# Patient Record
Sex: Male | Born: 1965 | Race: Black or African American | Hispanic: No | Marital: Married | Smoking: Current every day smoker
Health system: Southern US, Community
[De-identification: ages and names within clinical notes are randomized; demographics above are authoritative.]

## PROBLEM LIST (undated history)

## (undated) DIAGNOSIS — M419 Scoliosis, unspecified: Secondary | ICD-10-CM

## (undated) HISTORY — PX: HERNIA REPAIR: SHX51

---

## 2014-12-22 ENCOUNTER — Emergency Department (HOSPITAL_COMMUNITY): Payer: Self-pay

## 2014-12-22 ENCOUNTER — Emergency Department (HOSPITAL_COMMUNITY)
Admission: EM | Admit: 2014-12-22 | Discharge: 2014-12-22 | Disposition: A | Discharge status: Home / Self Care | Payer: Self-pay | Attending: Emergency Medicine | Admitting: Emergency Medicine

## 2014-12-22 ENCOUNTER — Encounter (HOSPITAL_COMMUNITY): Payer: Self-pay | Admitting: *Deleted

## 2014-12-22 DIAGNOSIS — Y990 Civilian activity done for income or pay: Secondary | ICD-10-CM | POA: Insufficient documentation

## 2014-12-22 DIAGNOSIS — Y9389 Activity, other specified: Secondary | ICD-10-CM | POA: Insufficient documentation

## 2014-12-22 DIAGNOSIS — Y9289 Other specified places as the place of occurrence of the external cause: Secondary | ICD-10-CM | POA: Insufficient documentation

## 2014-12-22 DIAGNOSIS — M62838 Other muscle spasm: Secondary | ICD-10-CM | POA: Insufficient documentation

## 2014-12-22 DIAGNOSIS — X58XXXA Exposure to other specified factors, initial encounter: Secondary | ICD-10-CM | POA: Insufficient documentation

## 2014-12-22 DIAGNOSIS — S39012A Strain of muscle, fascia and tendon of lower back, initial encounter: Secondary | ICD-10-CM | POA: Insufficient documentation

## 2014-12-22 DIAGNOSIS — Z720 Tobacco use: Secondary | ICD-10-CM | POA: Insufficient documentation

## 2014-12-22 HISTORY — DX: Scoliosis, unspecified: M41.9

## 2014-12-22 MED ORDER — TRAMADOL HCL 50 MG PO TABS: 50.0000 mg | ORAL_TABLET | Freq: Four times a day (QID) | ORAL | Status: AC | PRN

## 2014-12-22 MED ORDER — IBUPROFEN 800 MG PO TABS
800.0000 mg | ORAL_TABLET | Freq: Once | ORAL | Status: AC
  Administered 2014-12-22: 800 mg via ORAL
  Filled 2014-12-22: qty 1

## 2014-12-22 MED ORDER — TRAMADOL HCL 50 MG PO TABS
50.0000 mg | ORAL_TABLET | Freq: Once | ORAL | Status: AC
  Administered 2014-12-22: 50 mg via ORAL
  Filled 2014-12-22: qty 1

## 2014-12-22 MED ORDER — CYCLOBENZAPRINE HCL 5 MG PO TABS: 5.0000 mg | ORAL_TABLET | Freq: Three times a day (TID) | ORAL | Status: AC | PRN

## 2014-12-22 MED ORDER — IBUPROFEN 800 MG PO TABS: 800.0000 mg | ORAL_TABLET | Freq: Three times a day (TID) | ORAL | Status: AC

## 2014-12-22 NOTE — Discharge Instructions (Signed)
Lumbosacral Strain Lumbosacral strain is a strain of any of the parts that make up your lumbosacral vertebrae. Your lumbosacral vertebrae are the bones that make up the lower third of your backbone. Your lumbosacral vertebrae are held together by muscles and tough, fibrous tissue (ligaments).  CAUSES  A sudden blow to your back can cause lumbosacral strain. Also, anything that causes an excessive stretch of the muscles in the low back can cause this strain. This is typically seen when people exert themselves strenuously, fall, lift heavy objects, bend, or crouch repeatedly. RISK FACTORS  Physically demanding work.  Participation in pushing or pulling sports or sports that require a sudden twist of the back (tennis, golf, baseball).  Weight lifting.  Excessive lower back curvature.  Forward-tilted pelvis.  Weak back or abdominal muscles or both.  Tight hamstrings. SIGNS AND SYMPTOMS  Lumbosacral strain may cause pain in the area of your injury or pain that moves (radiates) down your leg.  DIAGNOSIS Your health care provider can often diagnose lumbosacral strain through a physical exam. In some cases, you may need tests such as X-ray exams.  TREATMENT  Treatment for your lower back injury depends on many factors that your clinician will have to evaluate. However, most treatment will include the use of anti-inflammatory medicines. HOME CARE INSTRUCTIONS   Avoid hard physical activities (tennis, racquetball, waterskiing) if you are not in proper physical condition for it. This may aggravate or create problems.  If you have a back problem, avoid sports requiring sudden body movements. Swimming and walking are generally safer activities.  Maintain good posture.  Maintain a healthy weight.  For acute conditions, you may put ice on the injured area.  Put ice in a plastic bag.  Place a towel between your skin and the bag.  Leave the ice on for 20 minutes, 2-3 times a day.  When the  low back starts healing, stretching and strengthening exercises may be recommended. SEEK MEDICAL CARE IF:  Your back pain is getting worse.  You experience severe back pain not relieved with medicines. SEEK IMMEDIATE MEDICAL CARE IF:   You have numbness, tingling, weakness, or problems with the use of your arms or legs.  There is a change in bowel or bladder control.  You have increasing pain in any area of the body, including your belly (abdomen).  You notice shortness of breath, dizziness, or feel faint.  You feel sick to your stomach (nauseous), are throwing up (vomiting), or become sweaty.  You notice discoloration of your toes or legs, or your feet get very cold. MAKE SURE YOU:   Understand these instructions.  Will watch your condition.  Will get help right away if you are not doing well or get worse. Document Released: 01/10/2005 Document Revised: 04/07/2013 Document Reviewed: 11/19/2012 [facility] Patient Information 2015 [place], [place]. This information is not intended to replace advice given to you by your health care provider. Make sure you discuss any questions you have with your health care provider.    Do not drive within 4 hours of taking tramadol or flexeril as these medicines can make you drowsy.  Avoid lifting,  Bending,  Twisting or any other activity that worsens your pain over the next week.  Apply an  icepack  to your lower back for 10-15 minutes every 2 hours for the next 2 days.  You should get rechecked if your symptoms are not better over the next 5 days,  Or you develop increased pain,  Weakness in  your leg(s) or loss of bladder or bowel function - these are symptoms of a worse injury.

## 2014-12-22 NOTE — ED Notes (Signed)
Pt states lower back pain after lifting cones at work. States stiffness to neck. Pain x 2 days.

## 2014-12-23 ENCOUNTER — Encounter (HOSPITAL_COMMUNITY): Payer: Self-pay | Admitting: Emergency Medicine

## 2014-12-23 ENCOUNTER — Emergency Department (HOSPITAL_COMMUNITY)
Admission: EM | Admit: 2014-12-23 | Discharge: 2014-12-23 | Disposition: A | Discharge status: Home / Self Care | Payer: Self-pay | Attending: Emergency Medicine | Admitting: Emergency Medicine

## 2014-12-23 DIAGNOSIS — M419 Scoliosis, unspecified: Secondary | ICD-10-CM | POA: Insufficient documentation

## 2014-12-23 DIAGNOSIS — M47896 Other spondylosis, lumbar region: Secondary | ICD-10-CM | POA: Insufficient documentation

## 2014-12-23 DIAGNOSIS — Z720 Tobacco use: Secondary | ICD-10-CM | POA: Insufficient documentation

## 2014-12-23 DIAGNOSIS — M545 Low back pain: Secondary | ICD-10-CM | POA: Insufficient documentation

## 2014-12-23 NOTE — ED Notes (Addendum)
Patient also requesting doctor's note for work stating he did not receive one yesterday.

## 2014-12-23 NOTE — ED Provider Notes (Signed)
CSN: [id number]     Arrival date & time 12/23/14  1929 History   First MD Initiated Contact with Patient 12/23/14 2151     Chief Complaint  Patient presents with  . Back Pain     (Consider location/radiation/quality/duration/timing/severity/associated sxs/prior Treatment) HPI Comments: Patient is a 49 year old male who presents to the emergency department with a complaint of back pain, and requesting a work note.  The patient was evaluated on yesterday September 7, at which time he states that he has significant pain and stiffness after lifting cones. The patient was evaluated. 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 There were no gross neurologic deficits appreciated at that time. The patient states however he did not receive a work note. He returns tonight for request a work note.  The history is provided by the patient.    Past Medical History  Diagnosis Date  . Scoliosis    Past Surgical History  Procedure Laterality Date  . Hernia repair     History reviewed. No pertinent family history. Social History  Substance Use Topics  . Smoking status: Current Every Day Smoker    Types: Cigarettes  . Smokeless tobacco: None  . Alcohol Use: Yes    Review of Systems  Musculoskeletal: Positive for back pain.  All other systems reviewed and are negative.     Allergies  Review of patient's allergies indicates no known allergies.  Home Medications   Prior to Admission medications   Medication Sig Start Date End Date Taking? Authorizing Provider  cyclobenzaprine (FLEXERIL) 5 MG tablet Take 1 tablet (5 mg total) by mouth 3 (three) times daily as needed. Patient not taking: Reported on 12/23/2014 12/22/14   Burgess Amor, PA-C  ibuprofen (ADVIL,MOTRIN) 800 MG tablet Take 1 tablet (800 mg total) by mouth 3 (three) times daily. Patient not taking: Reported on 12/23/2014 12/22/14   Burgess Amor, PA-C  traMADol (ULTRAM) 50 MG tablet Take 1 tablet (50 mg  total) by mouth every 6 (six) hours as needed. Patient not taking: Reported on 12/23/2014 12/22/14   Burgess Amor, PA-C   BP 117/64 mmHg  Pulse 69  Temp(Src) 98.4 F (36.9 C) (Oral)  Resp 18  SpO2 100% Physical Exam  Constitutional: He is oriented to person, place, and time. He appears well-developed and well-nourished.  Non-toxic appearance.  HENT:  Head: Normocephalic.  Right Ear: Tympanic membrane and external ear normal.  Left Ear: Tympanic membrane and external ear normal.  Eyes: EOM and lids are normal. Pupils are equal, round, and reactive to light.  Neck: Normal range of motion. Neck supple. Carotid bruit is not present.  Cardiovascular: Normal rate, regular rhythm, normal heart sounds, intact distal pulses and normal pulses.   Pulmonary/Chest: Breath sounds normal. No respiratory distress.  Abdominal: Soft. Bowel sounds are normal. There is no tenderness. There is no guarding.  Musculoskeletal: Normal range of motion.       Lumbar back: He exhibits tenderness and pain. He exhibits no deformity.  Lymphadenopathy:       Head (right side): No submandibular adenopathy present.       Head (left side): No submandibular adenopathy present.    He has no cervical adenopathy.  Neurological: He is alert and oriented to person, place, and time. He has normal strength. No cranial nerve deficit or sensory deficit.  Gait is steady. There is no foot drop. No gross neurologic deficits appreciated involving the lower extremities.  Skin: Skin is warm and dry.  Psychiatric:  He has a normal mood and affect. His speech is normal.  Nursing note and vitals reviewed.   ED Course  Procedures (including critical care time) Labs Review Labs Reviewed - No data to display  Imaging Review Dg Lumbar Spine Complete  12/22/2014   CLINICAL DATA:  Low back pain for 2 days radiating into the left leg and right side of the neck. No known injury.  EXAM: LUMBAR SPINE - COMPLETE 4+ VIEW  COMPARISON:  None.   FINDINGS: There is no fracture or malalignment. Mild loss of disc space height and endplate spurring are seen at L4-5 and L5-S1. No pars interarticularis defect is identified.  IMPRESSION: No acute abnormality.  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   Electronically Signed   By: Drusilla Kanner M.D.   On: 12/22/2014 19:47   I have personally reviewed and evaluated these images and lab results as part of my medical decision-making.   EKG Interpretation None      MDM  Vital signs within normal limits. I reviewed the x-ray from September 7, which reveals mild loss of disc space and endplate spurring at the L4-L5 and L5-S1 area. No gross neurologic deficit noted on tonight's examination. The patient will be given a work note to excuse him for September 7 and September 8. He will return to work on September 9. The Toradol was stopped at the patient's request. He states that it makes him nauseated, and makes him sleep too much. The patient is placed on Decadron 4 mg twice a day with food. Patient is referred to Dr. Victorino Dike for orthopedic evaluation of his back pain.    Final diagnoses:  None    *I have reviewed nursing notes, vital signs, and all appropriate lab and imaging results for this patient.**    Ivery Quale, PA-C 12/23/14 2224  Eber Hong, MD 12/24/14 [phone]

## 2014-12-23 NOTE — ED Notes (Signed)
Patient complaining of back pain for 3 days. History of chronic back pain. Reports was seen in ED yesterday for back pain and prescribed tramadol. Patient states he remembered after getting home that he does not do well with "generic drugs like tramdol". Patient states he tried taking tramadol, but it did not help with his pain.

## 2014-12-23 NOTE — Discharge Instructions (Signed)
Please see Dr. Victorino Dike for evaluation of your back if not improving. Back Pain, Adult Back pain is very common. The pain often gets better over time. The cause of back pain is usually not dangerous. Most people can learn to manage their back pain on their own.  HOME CARE   Stay active. Start with short walks on flat ground if you can. Try to walk farther each day.  Do not sit, drive, or stand in one place for more than 30 minutes. Do not stay in bed.  Do not avoid exercise or work. Activity can help your back heal faster.  Be careful when you bend or lift an object. Bend at your knees, keep the object close to you, and do not twist.  Sleep on a firm mattress. Lie on your side, and bend your knees. If you lie on your back, put a pillow under your knees.  Only take medicines as told by your doctor.  Put ice on the injured area.  Put ice in a plastic bag.  Place a towel between your skin and the bag.  Leave the ice on for 15-20 minutes, 03-04 times a day for the first 2 to 3 days. After that, you can switch between ice and heat packs.  Ask your doctor about back exercises or massage.  Avoid feeling anxious or stressed. Find good ways to deal with stress, such as exercise. GET HELP RIGHT AWAY IF:   Your pain does not go away with rest or medicine.  Your pain does not go away in 1 week.  You have new problems.  You do not feel well.  The pain spreads into your legs.  You cannot control when you poop (bowel movement) or pee (urinate).  Your arms or legs feel weak or lose feeling (numbness).  You feel sick to your stomach (nauseous) or throw up (vomit).  You have belly (abdominal) pain.  You feel like you may pass out (faint). MAKE SURE YOU:   Understand these instructions.  Will watch your condition.  Will get help right away if you are not doing well or get worse. Document Released: 09/19/2007 Document Revised: 06/25/2011 Document Reviewed: 08/04/2013 [facility]  Patient Information 2015 [place], [place]. This information is not intended to replace advice given to you by your health care provider. Make sure you discuss any questions you have with your health care provider.  Heat Therapy Heat therapy can help make painful, stiff muscles and joints feel better. Do not use heat on new injuries. Wait at least 48 hours after an injury to use heat. Do not use heat when you have aches or pains right after an activity. If you still have pain 3 hours after stopping the activity, then you may use heat. HOME CARE Wet heat pack  Soak a clean towel in warm water. Squeeze out the extra water.  Put the warm, wet towel in a plastic bag.  Place a thin, dry towel between your skin and the bag.  Put the heat pack on the area for 5 minutes, and check your skin. Your skin may be pink, but it should not be red.  Leave the heat pack on the area for 15 to 30 minutes.  Repeat this every 2 to 4 hours while awake. Do not use heat while you are sleeping. Warm water bath  Fill a tub with warm water.  Place the affected body part in the tub.  Soak the area for 20 to 40 minutes.  Repeat  as needed. Hot water bottle  Fill the water bottle half full with hot water.  Press out the extra air. Close the cap tightly.  Place a dry towel between your skin and the bottle.  Put the bottle on the area for 5 minutes, and check your skin. Your skin may be pink, but it should not be red.  Leave the bottle on the area for 15 to 30 minutes.  Repeat this every 2 to 4 hours while awake. Electric heating pad  Place a dry towel between your skin and the heating pad.  Set the heating pad on low heat.  Put the heating pad on the area for 10 minutes, and check your skin. Your skin may be pink, but it should not be red.  Leave the heating pad on the area for 20 to 40 minutes.  Repeat this every 2 to 4 hours while awake.  Do not lie on the heating pad.  Do not fall asleep while  using the heating pad.  Do not use the heating pad near water. GET HELP RIGHT AWAY IF:  You get blisters or red skin.  Your skin is puffy (swollen), or you lose feeling (numbness) in the affected area.  You have any new problems.  Your problems are getting worse.  You have any questions or concerns. If you have any problems, stop using heat therapy until you see your doctor. MAKE SURE YOU:  Understand these instructions.  Will watch your condition.  Will get help right away if you are not doing well or get worse. Document Released: 06/25/2011 Document Reviewed: 05/26/2013 [facility] Patient Information 2015 [place], [place]. This information is not intended to replace advice given to you by your health care provider. Make sure you discuss any questions you have with your health care provider. Activity out of

## 2014-12-24 NOTE — ED Provider Notes (Signed)
CSN: [id number]     Arrival date & time 12/22/14  1744 History   First MD Initiated Contact with Patient 12/22/14 1826     Chief Complaint  Patient presents with  . Back Pain     (Consider location/radiation/quality/duration/timing/severity/associated sxs/prior Treatment) The history is provided by the patient.   Gregery Walberg is a 49 y.o. male presenting with a 2 day history of low back pain with radiation into his upper back and shoulder area which is worsened with movement such as twisting his torso and stretching his neck.  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  He denies weakness, numbness in his extremities, also denies radiation of pain into the extremities, no urinary or bowel incontinence or retention. He does endorse occasional low back soreness but nothing long lasting like today.  He has found no alleviators.     Past Medical History  Diagnosis Date  . Scoliosis    Past Surgical History  Procedure Laterality Date  . Hernia repair     No family history on file. Social History  Substance Use Topics  . Smoking status: Current Every Day Smoker    Types: Cigarettes  . Smokeless tobacco: None  . Alcohol Use: Yes    Review of Systems  Constitutional: Negative for fever.  Musculoskeletal: Positive for myalgias and back pain. Negative for joint swelling.  Neurological: Negative for weakness and numbness.      Allergies  Review of patient's allergies indicates no known allergies.  Home Medications   Prior to Admission medications   Medication Sig Start Date End Date Taking? Authorizing Provider  cyclobenzaprine (FLEXERIL) 5 MG tablet Take 1 tablet (5 mg total) by mouth 3 (three) times daily as needed. Patient not taking: Reported on 12/23/2014 12/22/14   Burgess Amor, PA-C  ibuprofen (ADVIL,MOTRIN) 800 MG tablet Take 1 tablet (800 mg total) by mouth 3 (three) times daily. Patient not taking: Reported on  12/23/2014 12/22/14   Burgess Amor, PA-C  traMADol (ULTRAM) 50 MG tablet Take 1 tablet (50 mg total) by mouth every 6 (six) hours as needed. Patient not taking: Reported on 12/23/2014 12/22/14   Burgess Amor, PA-C   BP 130/68 mmHg  Pulse 70  Temp(Src) 98.4 F (36.9 C) (Oral)  Resp 16  Ht  (1.753 m)  Wt 173 lb (78.472 kg)  BMI 25.54 kg/m2  SpO2 100% Physical Exam  Constitutional: He appears well-developed and well-nourished.  HENT:  Head: Normocephalic.  Eyes: Conjunctivae are normal.  Neck: Normal range of motion. Neck supple.  Cardiovascular: Normal rate and intact distal pulses.   Pedal pulses normal.  Pulmonary/Chest: Effort normal.  Abdominal: Soft. Bowel sounds are normal. He exhibits no distension and no mass.  Musculoskeletal: Normal range of motion. He exhibits no edema.       Lumbar back: He exhibits tenderness. He exhibits no swelling, no edema and no spasm.  paralumbar ttp.  Soreness also across shoulders.  No cervical or lumbar bony tenderness.  Neurological: He is alert. He has normal strength. He displays no atrophy and no tremor. No sensory deficit. Gait normal.  Reflex Scores:      Patellar reflexes are 2+ on the right side and 2+ on the left side.      Achilles reflexes are 2+ on the right side and 2+ on the left side. No strength deficit noted in hip and knee flexor and extensor muscle groups.  Ankle flexion and  extension intact. Equal grip strength  Skin: Skin is warm and dry.  Psychiatric: He has a normal mood and affect.  Nursing note and vitals reviewed.   ED Course  Procedures (including critical care time) Labs Review Labs Reviewed - No data to display  Imaging Review Dg Lumbar Spine Complete  12/22/2014   CLINICAL DATA:  Low back pain for 2 days radiating into the left leg and right side of the neck. No known injury.  EXAM: LUMBAR SPINE - COMPLETE 4+ VIEW  COMPARISON:  None.  FINDINGS: There is no fracture or malalignment. Mild loss of disc space height and  endplate spurring are seen at L4-5 and L5-S1. No pars interarticularis defect is identified.  IMPRESSION: No acute abnormality.  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   Electronically Signed   By: Drusilla Kanner M.D.   On: 12/22/2014 19:47   I have personally reviewed and evaluated these images and lab results as part of my medical decision-making.   EKG Interpretation None      MDM   Final diagnoses:  Lumbar strain, initial encounter  Cervical paraspinous muscle spasm    No neuro deficit on exam or by history to suggest emergent or surgical presentation.  Discussed worsened sx that should prompt immediate re-evaluation including distal weakness, bowel/bladder retention/incontinence.  Pt prescribed ibuprofen, flexeril, tramadol, advised heat tx,  Activity as tolerated. Prn f/u for any worsened sx.       Burgess Amor, PA-C 12/24/14 2139  Mancel Bale, MD 01/02/15 [phone]

## 2016-11-04 IMAGING — DX DG LUMBAR SPINE COMPLETE 4+V
5 series · 5 of 5 positions shown · non-contrast
Comparison: None.

CLINICAL DATA: Low back pain for 2 days radiating into the left leg
and right side of the neck. No known injury.

EXAM:
LUMBAR SPINE - COMPLETE 4+ VIEW

[l-spine ap]
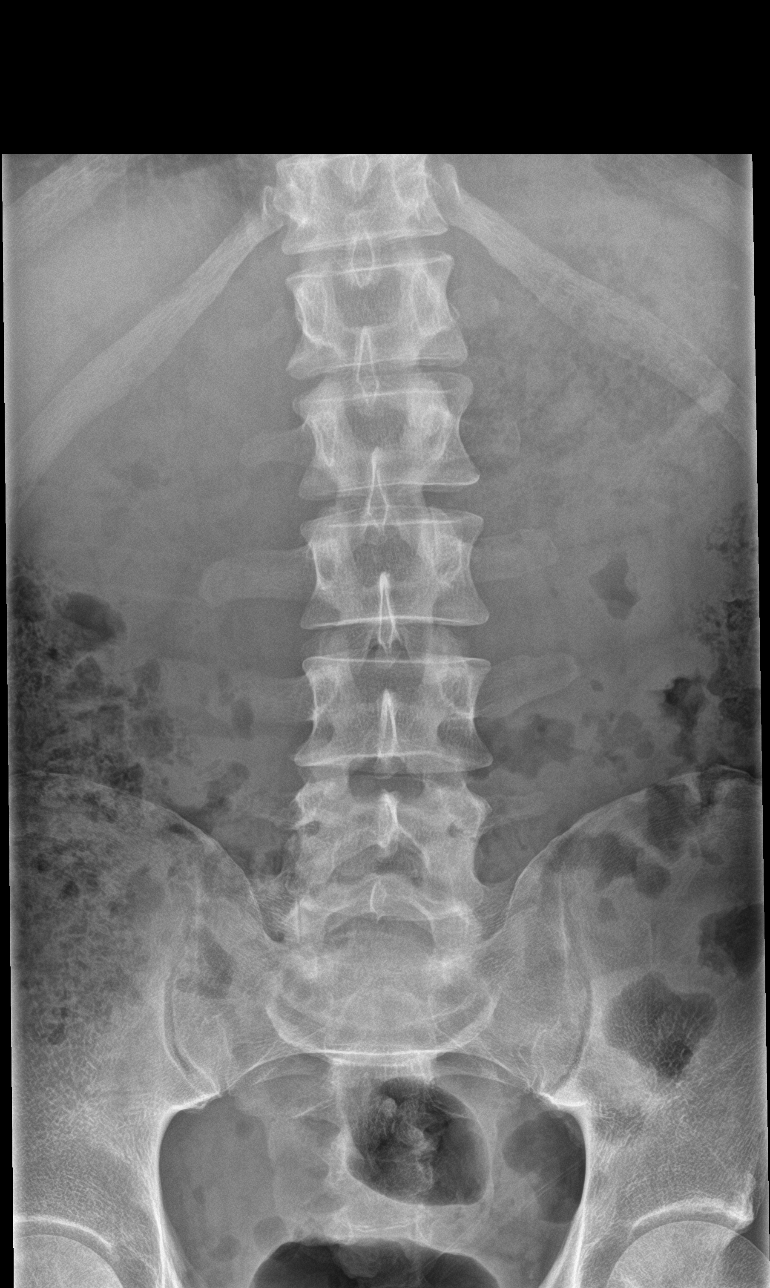

[l-spine obl (1 of 2)]
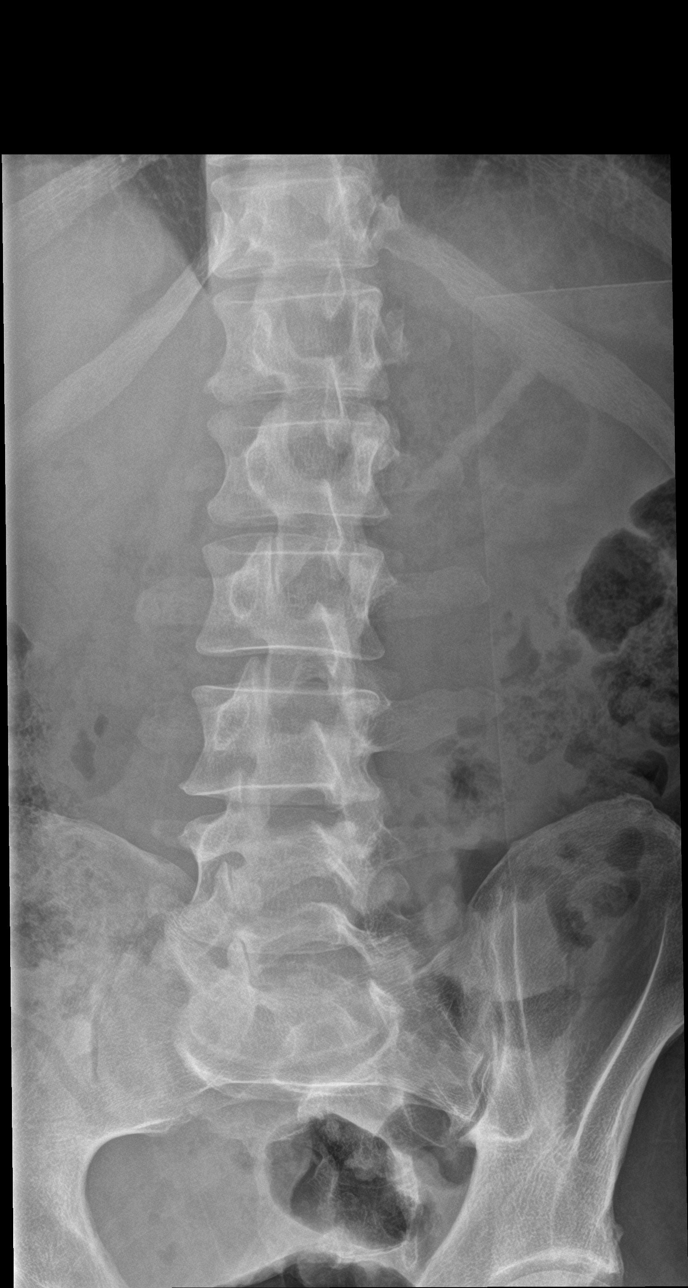

[l-spine obl (2 of 2)]
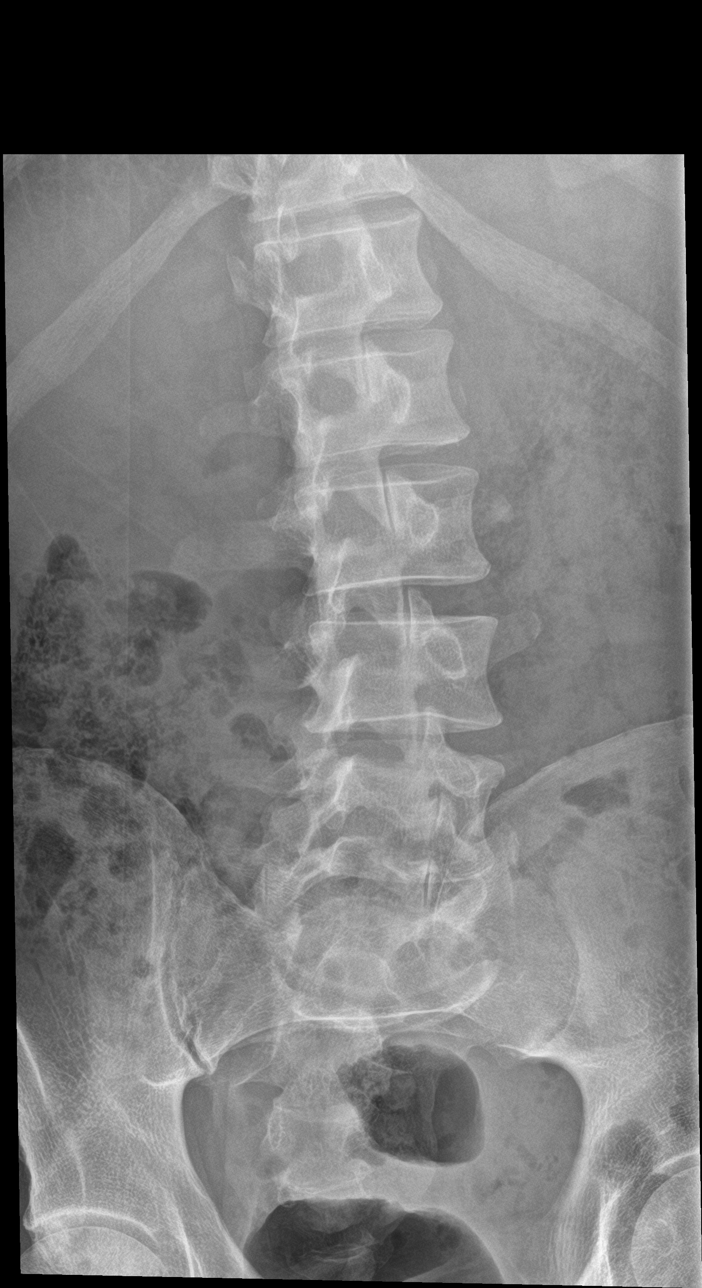

[l-spine lat]
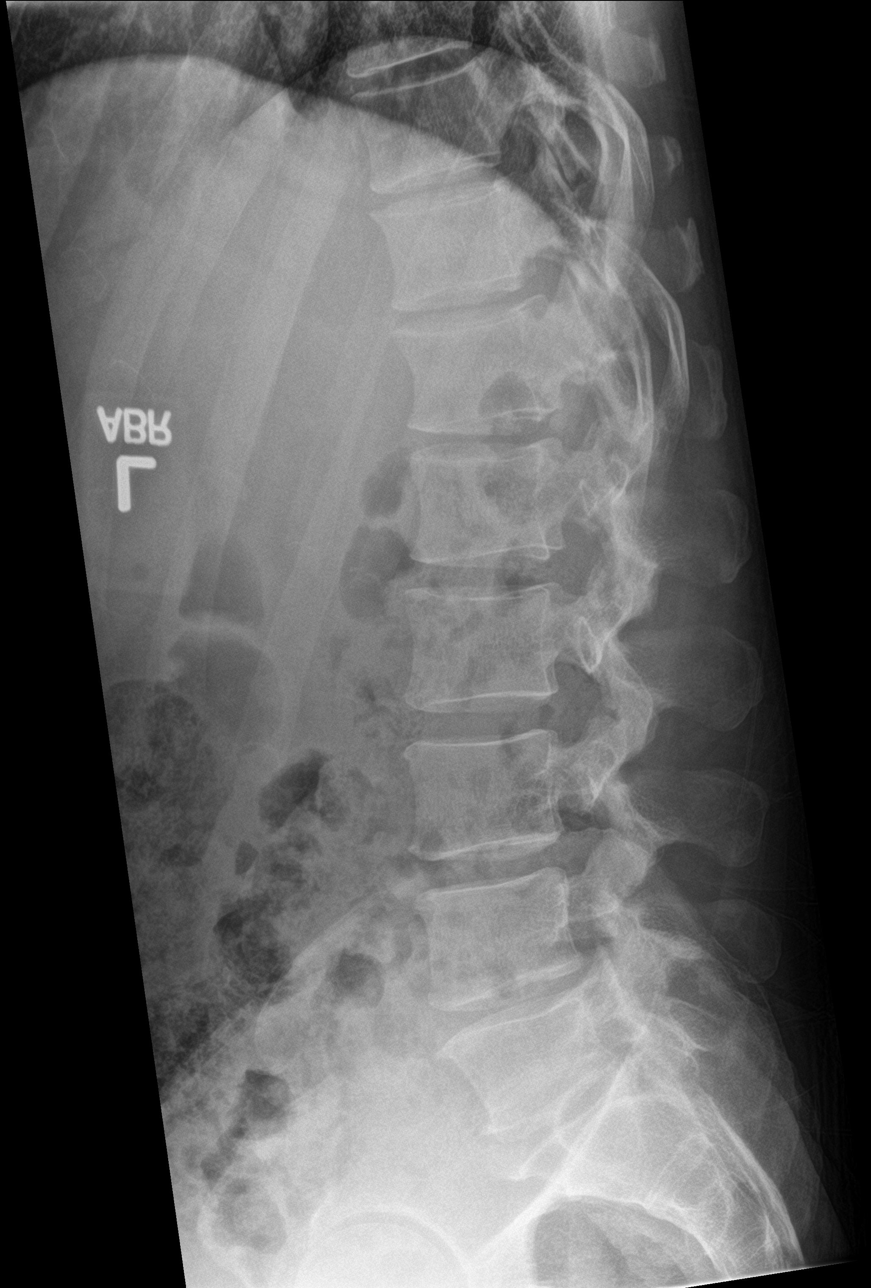

[l-spine spot]
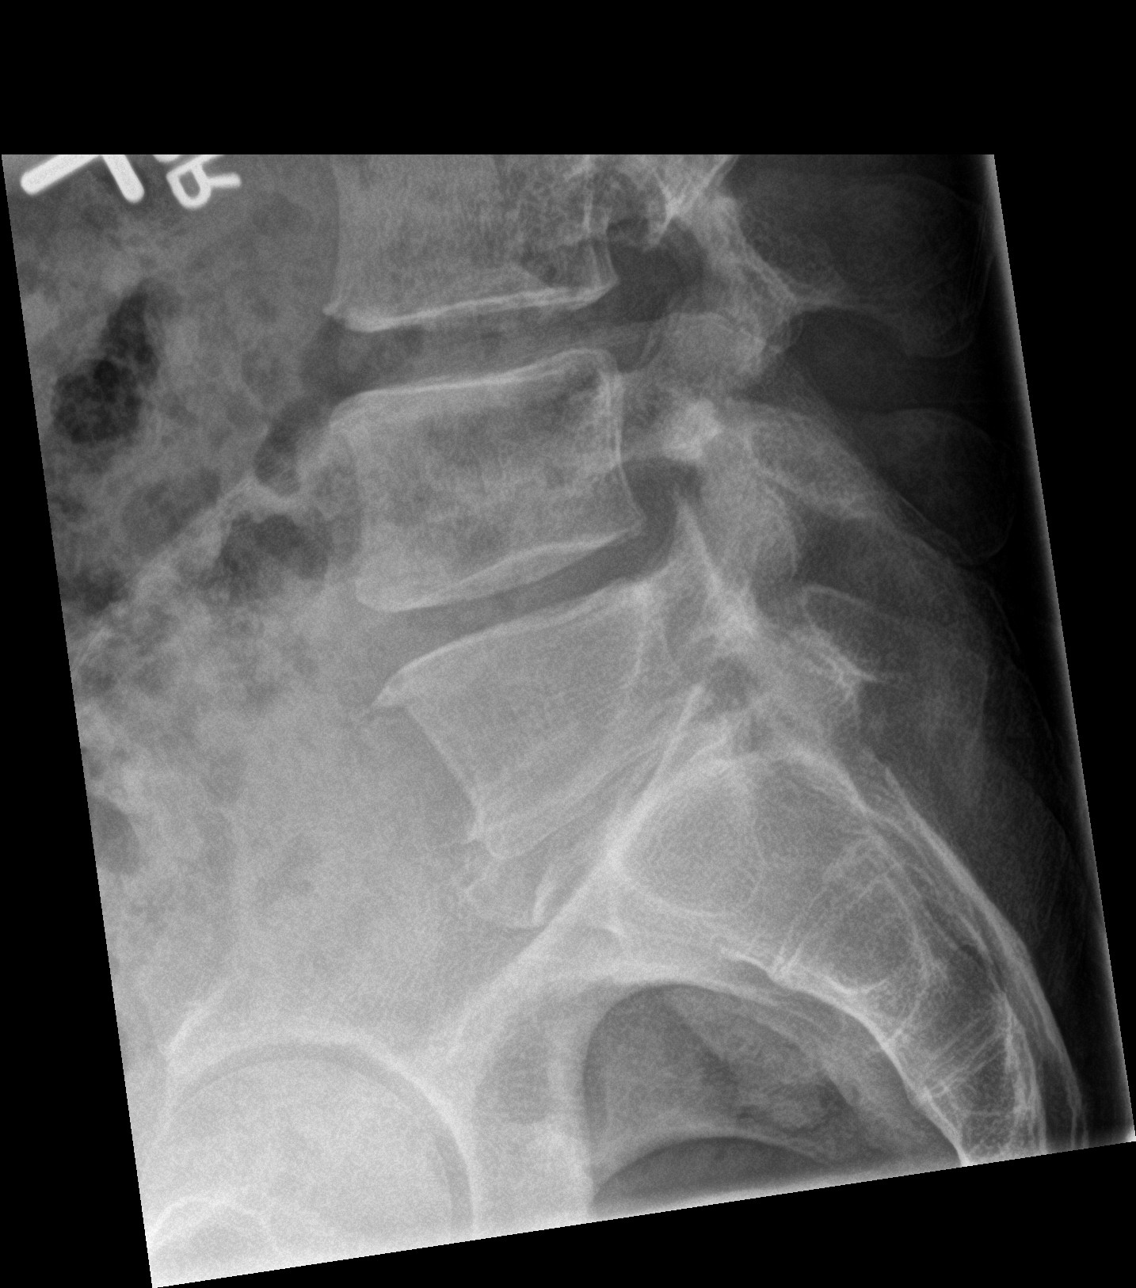

[5 of 5 positions shown; findings below may reference images not displayed]

FINDINGS: There is no fracture or malalignment. Mild loss of disc space height
and endplate spurring are seen at L4-5 and L5-S1. No pars
interarticularis defect is identified.
IMPRESSION: No acute abnormality.

Mild degenerative change L4-5 and L5-S1.

## 2020-11-16 ENCOUNTER — Emergency Department: Admit: 2020-11-16 | Discharge: 2020-11-17 | Disposition: A | Discharge status: Home / Self Care | Payer: Medicaid Other

## 2020-11-16 ENCOUNTER — Encounter: Admit: 2020-11-16 | Discharge: 2020-11-16 | Payer: Medicaid Other

## 2020-11-16 DIAGNOSIS — S0033XA Contusion of nose, initial encounter: Secondary | ICD-10-CM

## 2020-11-16 DIAGNOSIS — S134XXA Sprain of ligaments of cervical spine, initial encounter: Secondary | ICD-10-CM

## 2020-11-16 MED ORDER — METHOCARBAMOL 750 MG PO TAB
750 mg | ORAL_TABLET | Freq: Three times a day (TID) | ORAL | 0 refills | Status: AC | PRN
Start: 2020-11-16 — End: ?

## 2020-11-16 MED ORDER — NAPROXEN 500 MG PO TAB
500 mg | ORAL_TABLET | Freq: Two times a day (BID) | ORAL | 0 refills | Status: AC
Start: 2020-11-16 — End: ?
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
## 2020-11-22 ENCOUNTER — Encounter: Admit: 2020-11-22 | Discharge: 2020-11-22 | Payer: Medicaid Other

## 2020-11-22 ENCOUNTER — Emergency Department: Admit: 2020-11-22 | Discharge: 2020-11-22 | Payer: Medicaid Other

## 2020-11-22 ENCOUNTER — Emergency Department: Admit: 2020-11-22 | Discharge: 2020-11-23 | Disposition: A | Discharge status: Home / Self Care | Payer: Medicaid Other

## 2020-11-22 DIAGNOSIS — S060X0D Concussion without loss of consciousness, subsequent encounter: Secondary | ICD-10-CM

## 2020-11-22 DIAGNOSIS — M545 Acute midline low back pain without sciatica: Secondary | ICD-10-CM

## 2020-11-22 MED ORDER — NAPROXEN 500 MG PO TAB
500 mg | ORAL_TABLET | Freq: Two times a day (BID) | ORAL | 0 refills | Status: AC
Start: 2020-11-22 — End: ?

## 2020-11-22 MED ORDER — CYCLOBENZAPRINE 10 MG PO TAB
10 mg | ORAL_TABLET | Freq: Three times a day (TID) | ORAL | 0 refills | 30.00000 days | Status: AC | PRN
Start: 2020-11-22 — End: ?
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
## 2020-11-23 NOTE — ED Notes
Pt A&Ox4 and given discharge instructions and follow up information. Pt verbalizes understanding and has no further questions or complaints. VSS. Pt ambulated out of ED w/ steady gait to go home by POV.
